# Patient Record
Sex: Female | Born: 2000 | Race: Black or African American | Hispanic: No | Marital: Single | Smoking: Never smoker
Health system: Southern US, Community
[De-identification: ages and names within clinical notes are randomized; demographics above are authoritative.]

---
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
## 2021-01-11 IMAGING — US US ABDOMEN LIMITED
1 series · 14 of 25 positions shown · non-contrast
Comparison: None.

CLINICAL DATA: 17-year-old female with history of right upper
quadrant abdominal pain for the past 4-5 days.

EXAM:
ULTRASOUND ABDOMEN LIMITED RIGHT UPPER QUADRANT

[Series 1: us abdomen limited · 0.20mm/px · 14 of 37 slices shown]
[im 1/37]
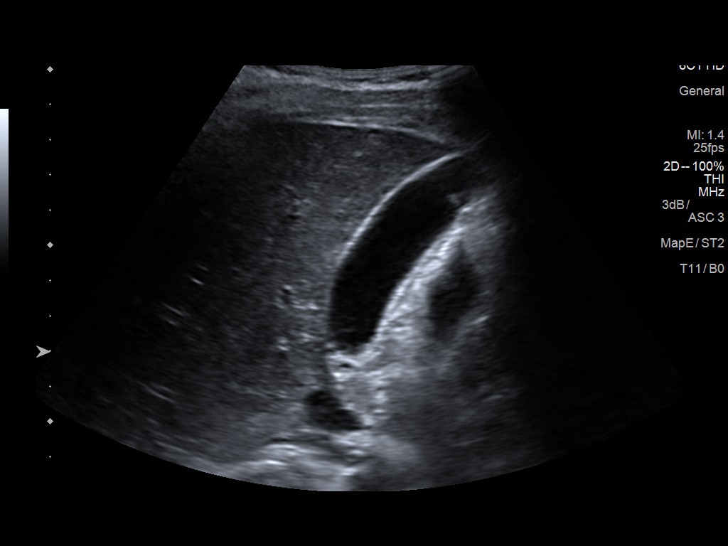
[im 4/37]
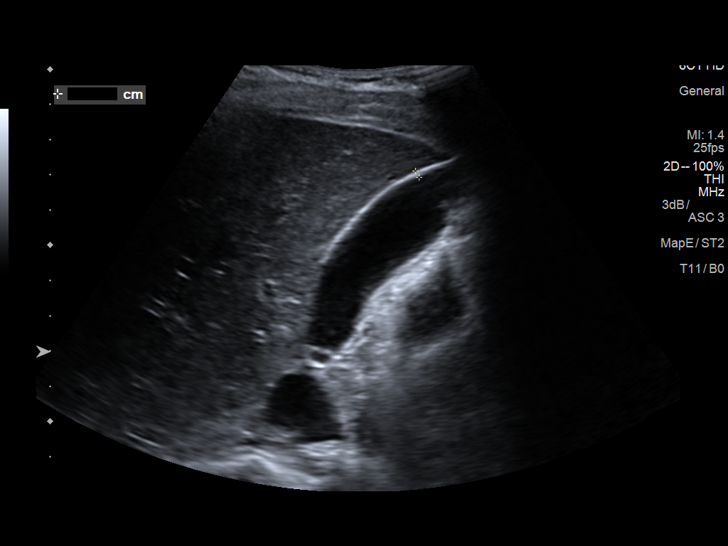
[im 7/37]
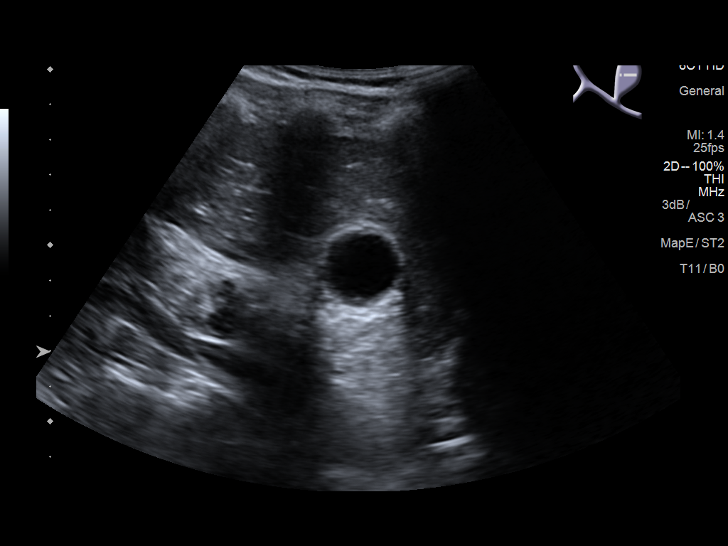
[im 10/37]
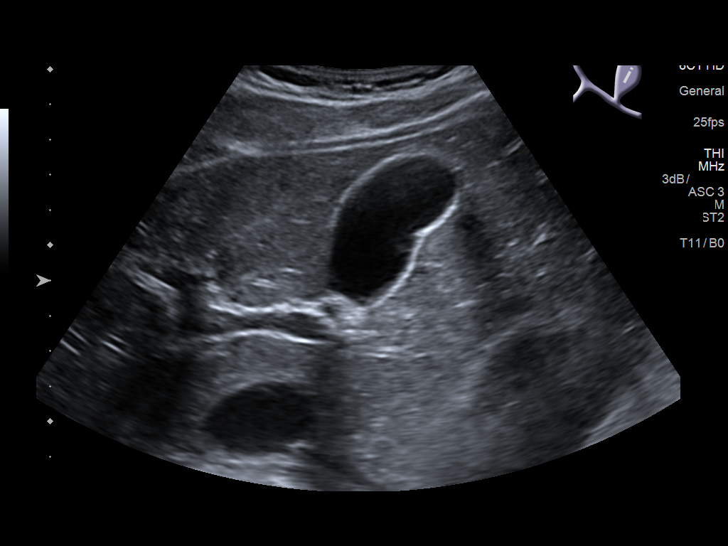
[im 13/37]
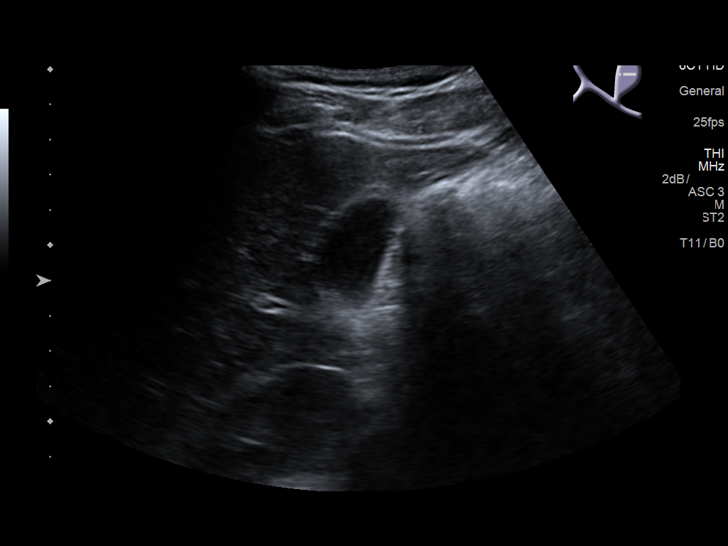
[im 14/37]
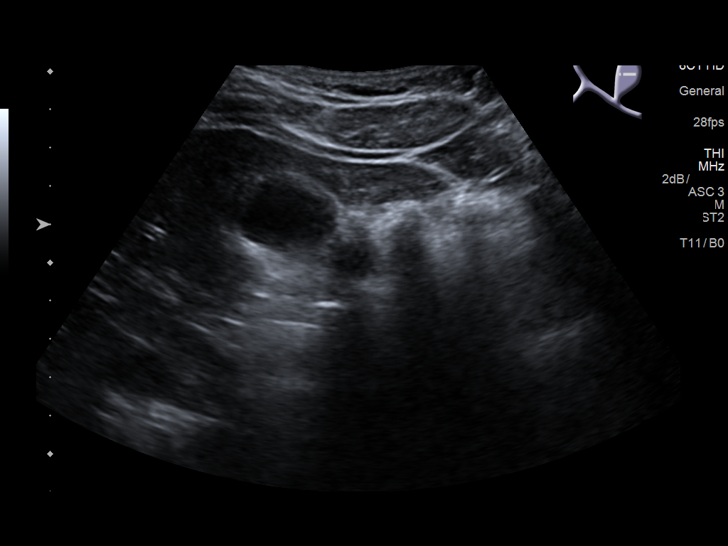
[im 17/37]
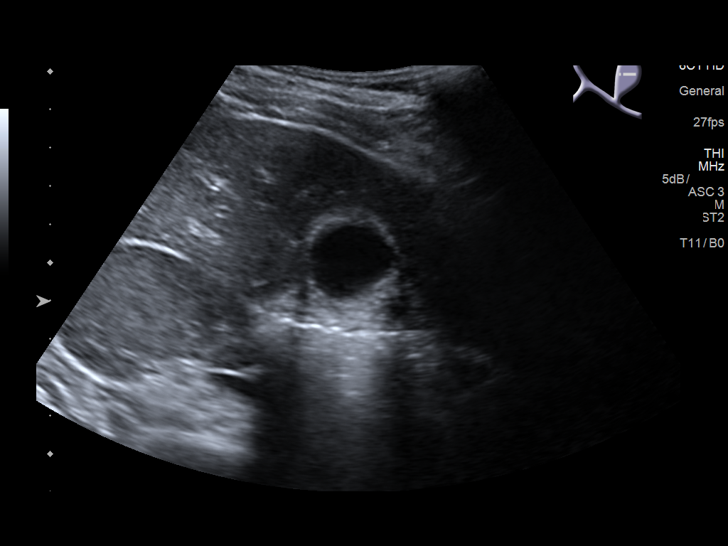
[im 20/37]
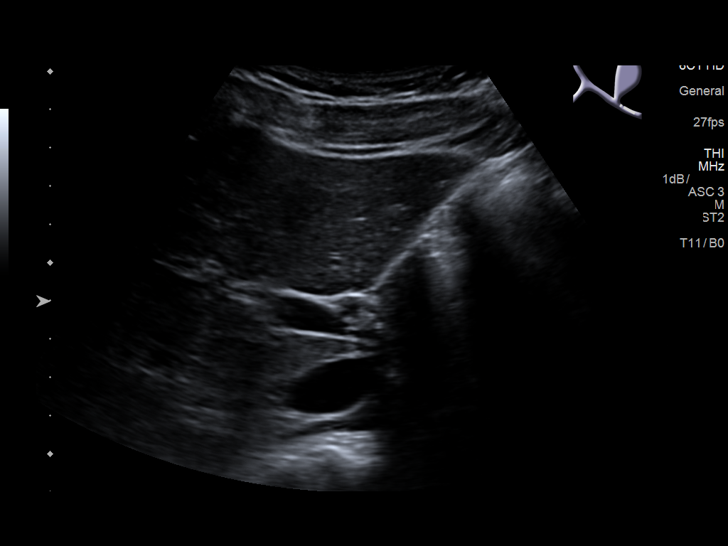
[im 23/37]
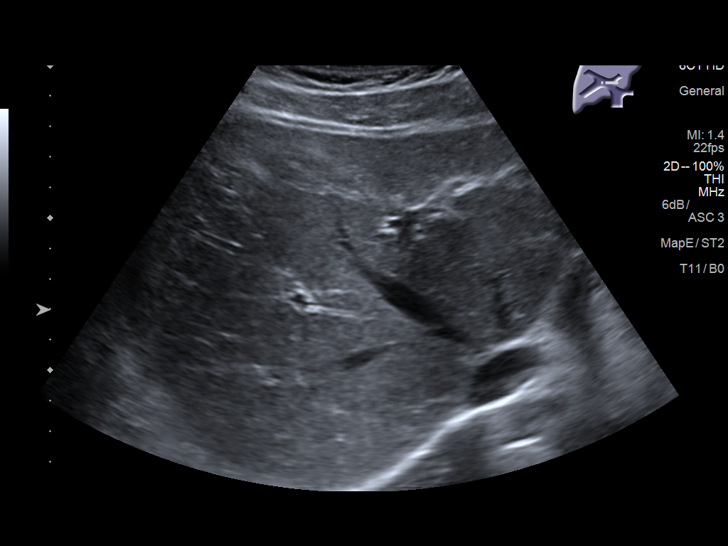
[im 25/37]
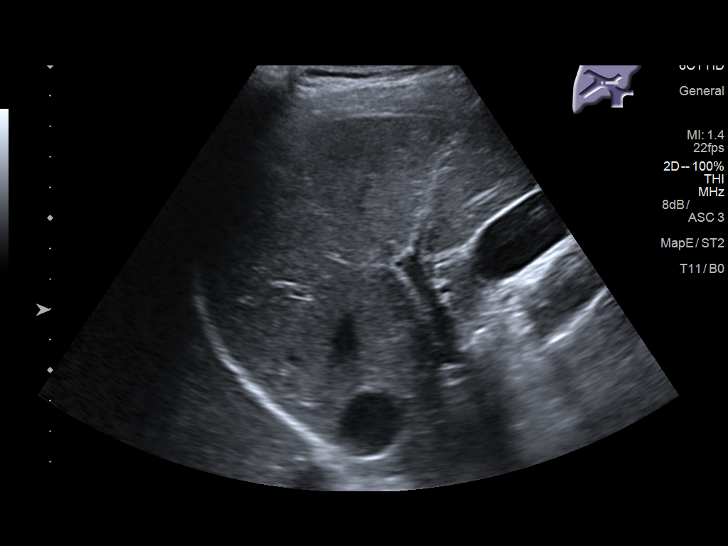
[im 28/37]
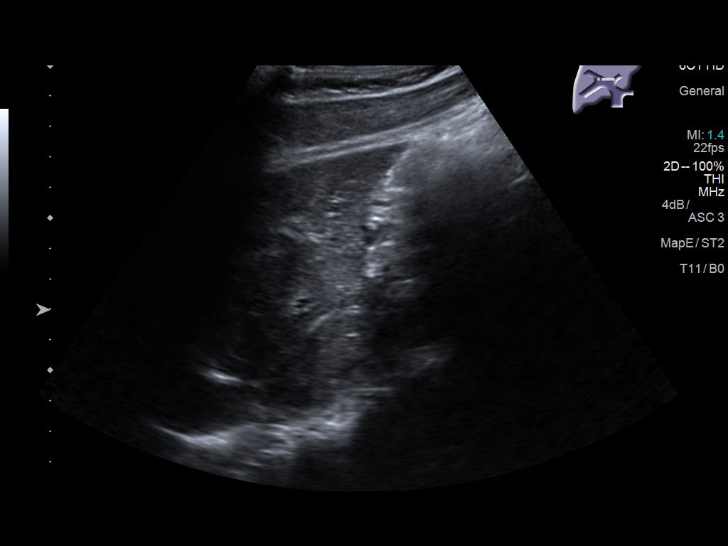
[im 31/37]
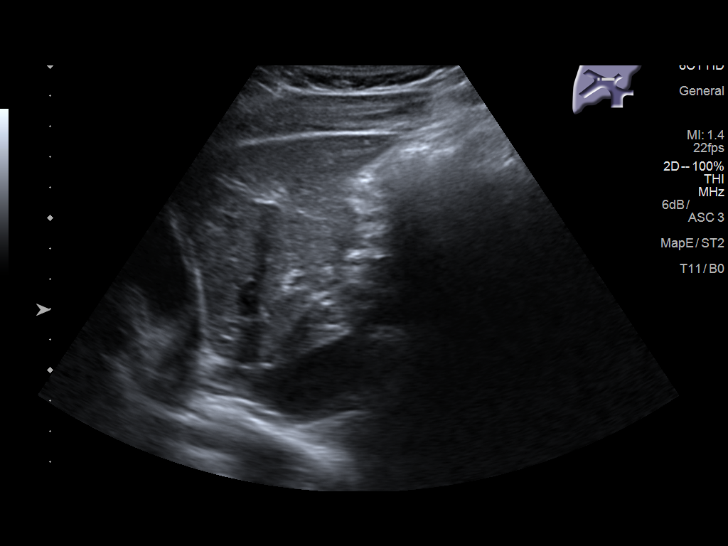
[im 34/37]
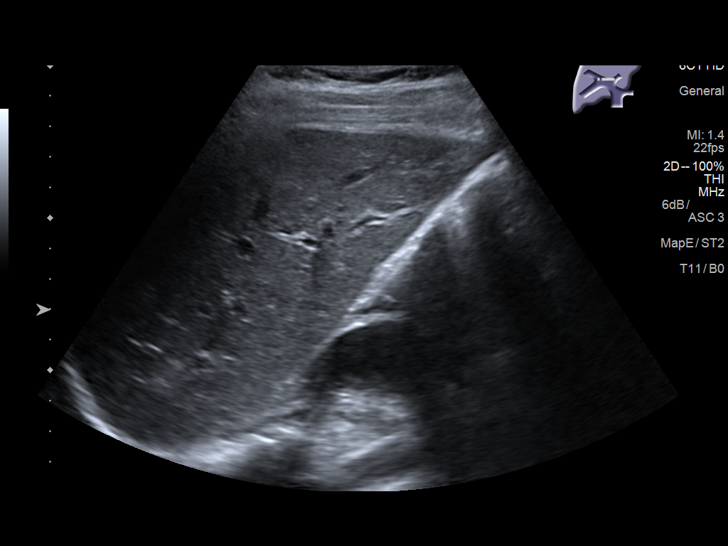
[im 37/37]
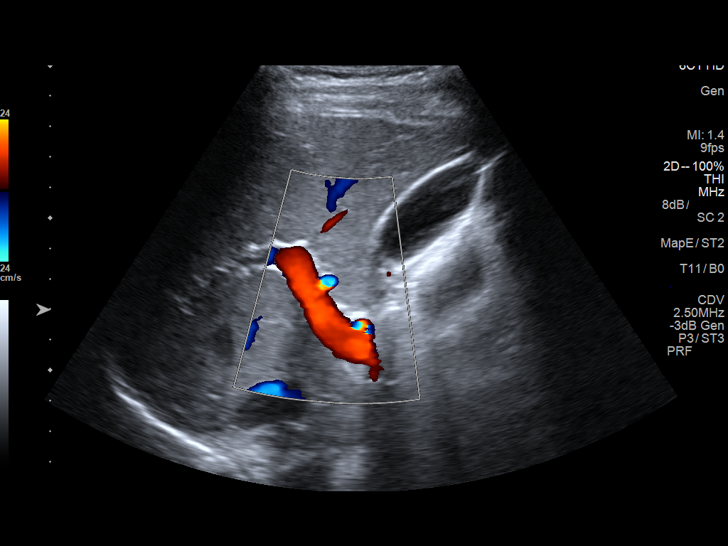

[14 of 25 positions shown; findings below may reference images not displayed]

FINDINGS: Gallbladder:

No gallstones or wall thickening visualized. No sonographic Murphy
sign noted by sonographer.

Common bile duct:

Diameter: 1.7 mm

Liver:

No focal lesion identified. Within normal limits in parenchymal
echogenicity. Portal vein is patent on color Doppler imaging with
normal direction of blood flow towards the liver.
IMPRESSION: 1. No acute findings. Specifically, no gallstones or findings to
suggest an acute cholecystitis at this time
